# Patient Record
Sex: Female | Born: 1935 | Race: Black or African American | Hispanic: No | Marital: Single
Health system: Southern US, Community
[De-identification: ages and names within clinical notes are randomized; demographics above are authoritative.]

---

## 2004-09-11 ENCOUNTER — Ambulatory Visit: Payer: Self-pay | Admitting: Family Medicine

## 2004-10-26 ENCOUNTER — Emergency Department: Payer: Self-pay | Admitting: Unknown Physician Specialty

## 2005-09-23 ENCOUNTER — Ambulatory Visit: Payer: Self-pay | Admitting: Family Medicine

## 2006-03-31 ENCOUNTER — Emergency Department: Payer: Self-pay | Admitting: Emergency Medicine

## 2006-09-24 ENCOUNTER — Ambulatory Visit: Payer: Self-pay | Admitting: Family Medicine

## 2006-10-01 ENCOUNTER — Other Ambulatory Visit: Payer: Self-pay

## 2006-10-01 ENCOUNTER — Emergency Department: Payer: Self-pay

## 2006-10-04 ENCOUNTER — Emergency Department: Payer: Self-pay | Admitting: Emergency Medicine

## 2006-10-04 ENCOUNTER — Other Ambulatory Visit: Payer: Self-pay

## 2006-10-06 ENCOUNTER — Other Ambulatory Visit: Payer: Self-pay

## 2006-10-06 ENCOUNTER — Emergency Department: Payer: Self-pay | Admitting: Internal Medicine

## 2006-10-17 ENCOUNTER — Other Ambulatory Visit: Payer: Self-pay

## 2006-10-17 ENCOUNTER — Emergency Department: Payer: Self-pay | Admitting: Emergency Medicine

## 2006-12-06 ENCOUNTER — Emergency Department: Payer: Self-pay | Admitting: General Practice

## 2006-12-09 ENCOUNTER — Emergency Department: Payer: Self-pay | Admitting: Emergency Medicine

## 2006-12-18 ENCOUNTER — Emergency Department: Payer: Self-pay | Admitting: Emergency Medicine

## 2006-12-21 ENCOUNTER — Ambulatory Visit: Payer: Self-pay | Admitting: Urology

## 2006-12-24 ENCOUNTER — Ambulatory Visit: Payer: Self-pay | Admitting: Urology

## 2007-02-27 ENCOUNTER — Emergency Department: Payer: Self-pay | Admitting: Emergency Medicine

## 2007-02-27 ENCOUNTER — Other Ambulatory Visit: Payer: Self-pay

## 2007-03-09 ENCOUNTER — Emergency Department: Payer: Self-pay | Admitting: Emergency Medicine

## 2007-03-09 ENCOUNTER — Other Ambulatory Visit: Payer: Self-pay

## 2007-05-30 ENCOUNTER — Other Ambulatory Visit: Payer: Self-pay

## 2007-05-30 ENCOUNTER — Emergency Department: Payer: Self-pay | Admitting: Emergency Medicine

## 2007-06-03 ENCOUNTER — Emergency Department: Payer: Self-pay | Admitting: Emergency Medicine

## 2007-06-11 ENCOUNTER — Ambulatory Visit: Payer: Self-pay | Admitting: Unknown Physician Specialty

## 2007-08-16 ENCOUNTER — Ambulatory Visit: Payer: Self-pay | Admitting: Unknown Physician Specialty

## 2007-08-23 IMAGING — CR DG CHEST 2V
1 series · 2 of 2 positions shown · non-contrast
Comparison: none

REASON FOR EXAM: Weakness
COMMENTS:

[Series 1: view not recorded · 0.17mm/px · 2 of 2 slices shown]
[im 1/2]
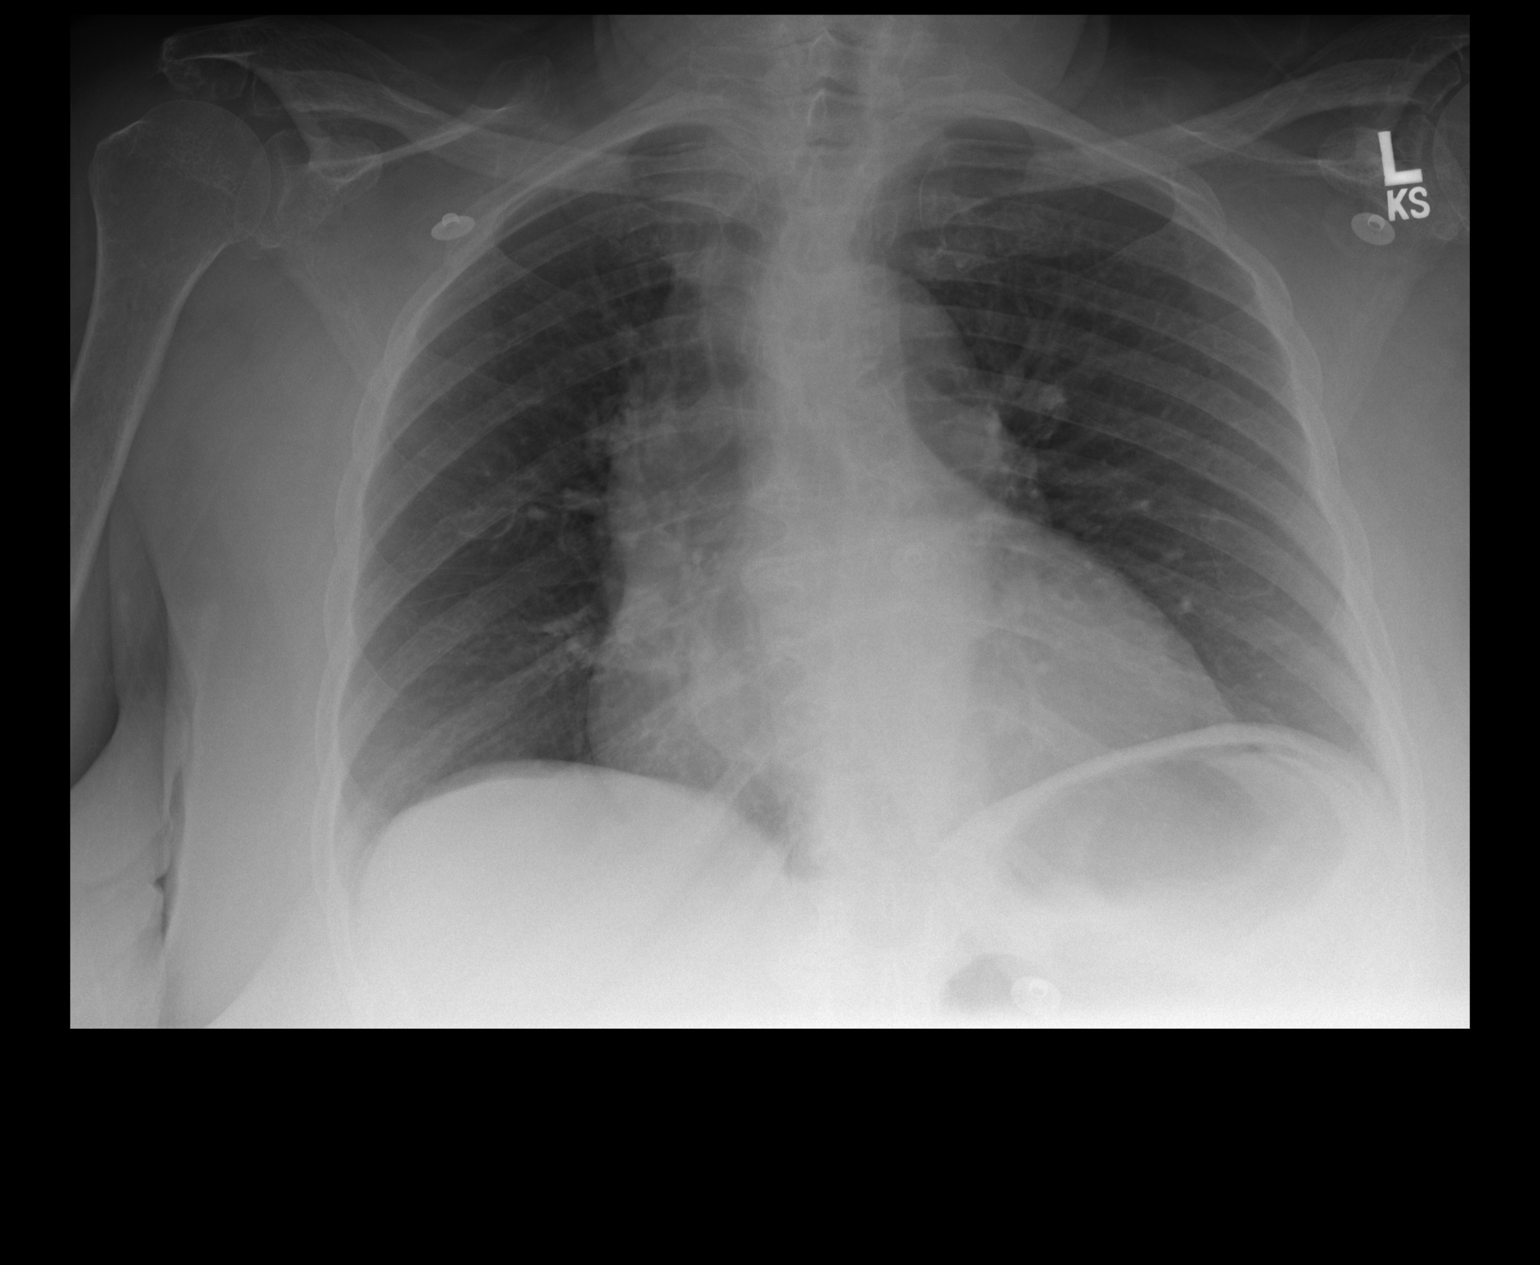
[im 2/2]
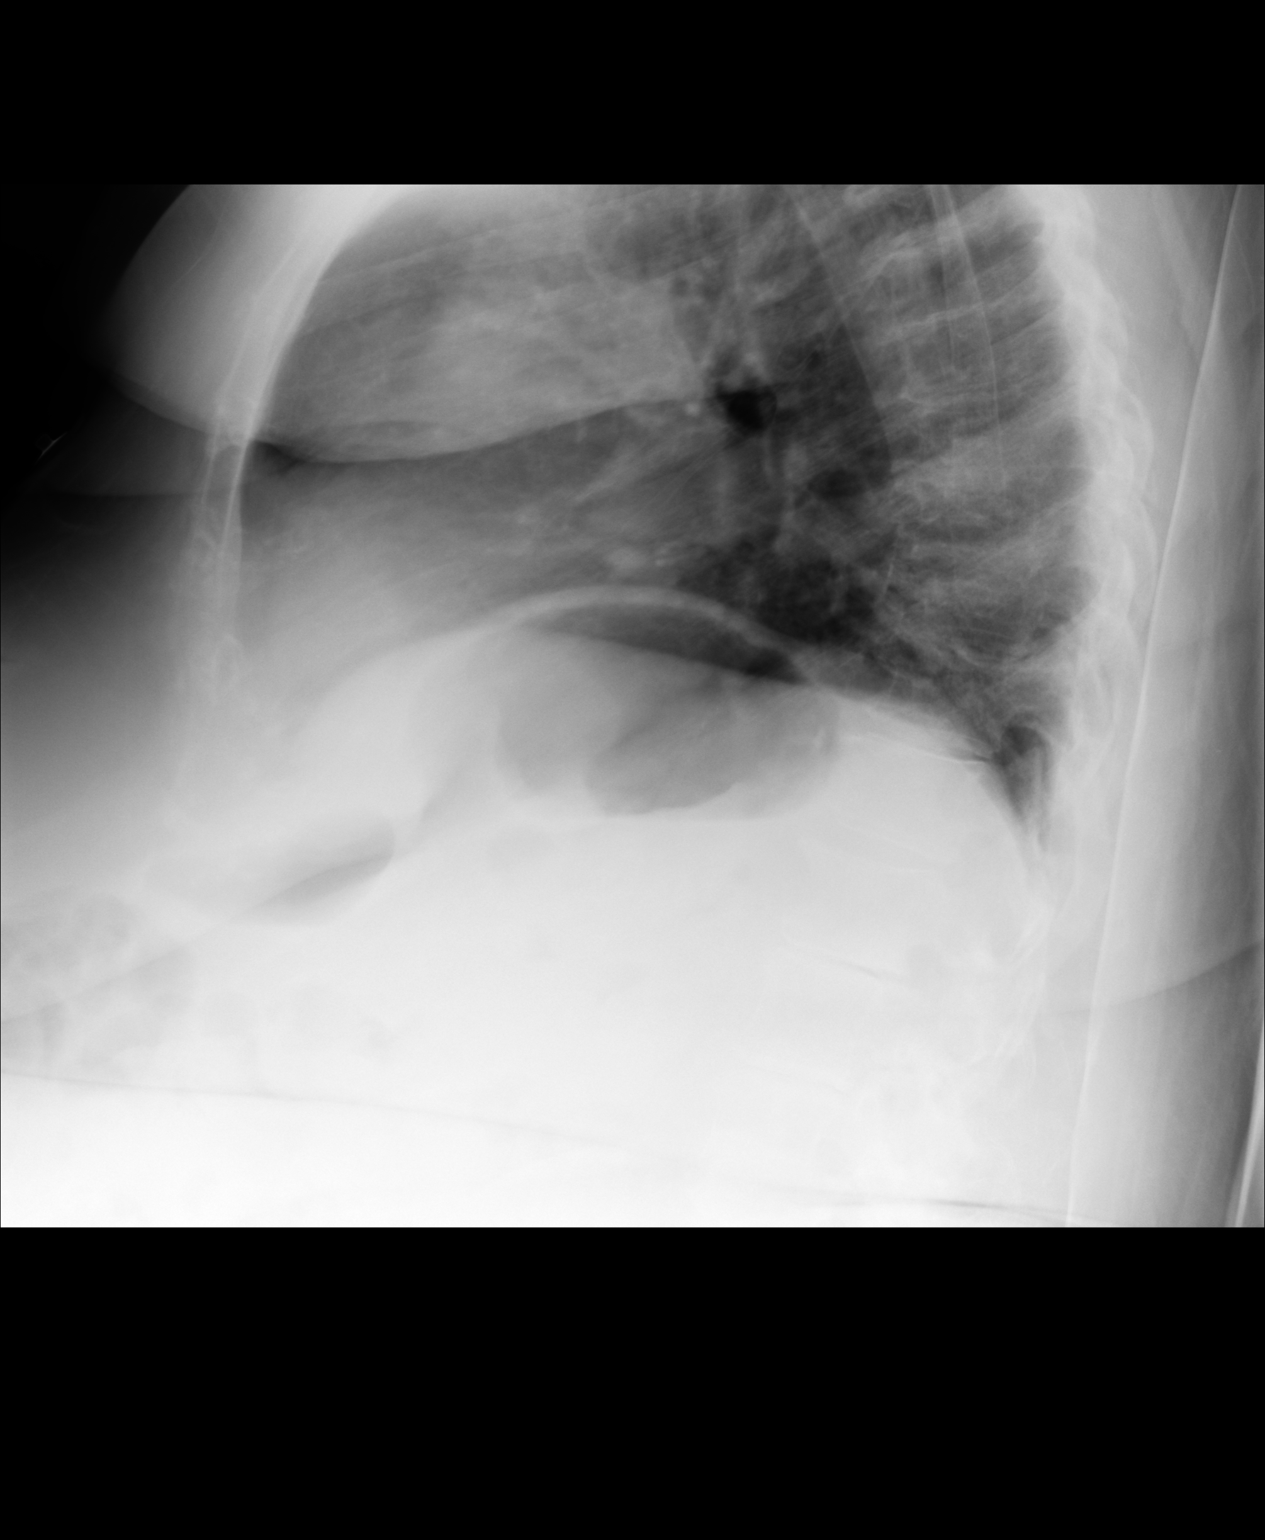

[2 of 2 positions shown; findings below may reference images not displayed]

PROCEDURE:     DXR - DXR CHEST PA (OR AP) AND LATERAL  - October 04, 2006 [DATE]

RESULT:     AP and lateral view were obtained and compared to 10/01/06.  The
heart is top normal in size.  The aortic arch is mildly tortuous.  The lung
fields appear clear with no effusions.  Vascularity is within normal limits.
 Degenerative spurs are noted in the thoracic spine.
IMPRESSION: Lung fields are clear.

## 2007-08-30 ENCOUNTER — Ambulatory Visit: Payer: Self-pay | Admitting: Unknown Physician Specialty

## 2007-11-09 ENCOUNTER — Ambulatory Visit: Payer: Self-pay | Admitting: Family Medicine

## 2008-02-14 ENCOUNTER — Ambulatory Visit: Payer: Self-pay | Admitting: Unknown Physician Specialty

## 2009-04-10 ENCOUNTER — Ambulatory Visit: Payer: Self-pay | Admitting: Ophthalmology

## 2009-07-26 ENCOUNTER — Inpatient Hospital Stay: Payer: Self-pay | Admitting: Unknown Physician Specialty

## 2014-09-12 ENCOUNTER — Ambulatory Visit: Payer: Self-pay | Admitting: Internal Medicine

## 2014-09-20 ENCOUNTER — Inpatient Hospital Stay: Payer: Self-pay | Admitting: Internal Medicine

## 2014-09-20 LAB — COMPREHENSIVE METABOLIC PANEL
AST: 16 U/L (ref 15–37)
Albumin: 3.1 g/dL — ABNORMAL LOW (ref 3.4–5.0)
Alkaline Phosphatase: 71 U/L
Anion Gap: 9 (ref 7–16)
BUN: 27 mg/dL — ABNORMAL HIGH (ref 7–18)
Bilirubin,Total: 0.5 mg/dL (ref 0.2–1.0)
CHLORIDE: 112 mmol/L — AB (ref 98–107)
CO2: 28 mmol/L (ref 21–32)
CREATININE: 1.31 mg/dL — AB (ref 0.60–1.30)
Calcium, Total: 8.7 mg/dL (ref 8.5–10.1)
EGFR (African American): 50 — ABNORMAL LOW
EGFR (Non-African Amer.): 41 — ABNORMAL LOW
Glucose: 119 mg/dL — ABNORMAL HIGH (ref 65–99)
OSMOLALITY: 302 (ref 275–301)
POTASSIUM: 3.9 mmol/L (ref 3.5–5.1)
SGPT (ALT): 13 U/L — ABNORMAL LOW
SODIUM: 149 mmol/L — AB (ref 136–145)
Total Protein: 7.3 g/dL (ref 6.4–8.2)

## 2014-09-20 LAB — URINALYSIS, COMPLETE
BLOOD: NEGATIVE
Bacteria: NONE SEEN
Bilirubin,UR: NEGATIVE
Glucose,UR: NEGATIVE mg/dL (ref 0–75)
Ketone: NEGATIVE
Nitrite: NEGATIVE
PH: 5 (ref 4.5–8.0)
PROTEIN: NEGATIVE
RBC,UR: 1 /HPF (ref 0–5)
Specific Gravity: 1.025 (ref 1.003–1.030)
WBC UR: 1 /HPF (ref 0–5)

## 2014-09-20 LAB — CBC
HCT: 29.9 % — AB (ref 35.0–47.0)
HGB: 9.7 g/dL — ABNORMAL LOW (ref 12.0–16.0)
MCH: 31.5 pg (ref 26.0–34.0)
MCHC: 32.2 g/dL (ref 32.0–36.0)
MCV: 98 fL (ref 80–100)
Platelet: 193 10*3/uL (ref 150–440)
RBC: 3.06 10*6/uL — ABNORMAL LOW (ref 3.80–5.20)
RDW: 12.1 % (ref 11.5–14.5)
WBC: 5.8 10*3/uL (ref 3.6–11.0)

## 2014-09-20 LAB — HEMOGLOBIN: HGB: 10.2 g/dL — ABNORMAL LOW (ref 12.0–16.0)

## 2014-09-20 LAB — PROTIME-INR
INR: 1.1
PROTHROMBIN TIME: 14.4 s (ref 11.5–14.7)

## 2014-09-20 LAB — LIPASE, BLOOD: Lipase: 114 U/L (ref 73–393)

## 2014-09-20 LAB — TROPONIN I: Troponin-I: <0.02 ng/mL

## 2014-09-21 LAB — BASIC METABOLIC PANEL
Anion Gap: 7 (ref 7–16)
BUN: 25 mg/dL — AB (ref 7–18)
CALCIUM: 7.6 mg/dL — AB (ref 8.5–10.1)
CHLORIDE: 117 mmol/L — AB (ref 98–107)
Co2: 23 mmol/L (ref 21–32)
Creatinine: 1.15 mg/dL (ref 0.60–1.30)
GFR CALC AF AMER: 58 — AB
GFR CALC NON AF AMER: 48 — AB
GLUCOSE: 182 mg/dL — AB (ref 65–99)
Osmolality: 301 (ref 275–301)
POTASSIUM: 4 mmol/L (ref 3.5–5.1)
SODIUM: 147 mmol/L — AB (ref 136–145)

## 2014-09-21 LAB — CBC WITH DIFFERENTIAL/PLATELET
Basophil #: 0 10*3/uL (ref 0.0–0.1)
Basophil #: 0 10*3/uL (ref 0.0–0.1)
Basophil %: 0.2 %
Basophil %: 0.1 %
EOS ABS: 0 10*3/uL (ref 0.0–0.7)
EOS PCT: 0.1 %
Eosinophil #: 0 10*3/uL (ref 0.0–0.7)
Eosinophil %: 0.1 %
HCT: 27.4 % — AB (ref 35.0–47.0)
HCT: 25.8 % — AB (ref 35.0–47.0)
HGB: 8.9 g/dL — ABNORMAL LOW (ref 12.0–16.0)
HGB: 8.3 g/dL — ABNORMAL LOW (ref 12.0–16.0)
LYMPHS PCT: 5.7 %
LYMPHS PCT: 15.5 %
Lymphocyte #: 0.6 10*3/uL — ABNORMAL LOW (ref 1.0–3.6)
Lymphocyte #: 1.6 10*3/uL (ref 1.0–3.6)
MCH: 29.7 pg (ref 26.0–34.0)
MCH: 29.9 pg (ref 26.0–34.0)
MCHC: 32.3 g/dL (ref 32.0–36.0)
MCHC: 32.1 g/dL (ref 32.0–36.0)
MCV: 92 fL (ref 80–100)
MCV: 93 fL (ref 80–100)
MONO ABS: 1 x10 3/mm — AB (ref 0.2–0.9)
MONOS PCT: 9 %
Monocyte #: 1 x10 3/mm — ABNORMAL HIGH (ref 0.2–0.9)
Monocyte %: 9.5 %
Neutrophil #: 9.5 10*3/uL — ABNORMAL HIGH (ref 1.4–6.5)
Neutrophil #: 7.6 10*3/uL — ABNORMAL HIGH (ref 1.4–6.5)
Neutrophil %: 85 %
Neutrophil %: 74.8 %
PLATELETS: 128 10*3/uL — AB (ref 150–440)
PLATELETS: 124 10*3/uL — AB (ref 150–440)
RBC: 2.98 10*6/uL — ABNORMAL LOW (ref 3.80–5.20)
RBC: 2.77 10*6/uL — AB (ref 3.80–5.20)
RDW: 16 % — AB (ref 11.5–14.5)
RDW: 16.4 % — ABNORMAL HIGH (ref 11.5–14.5)
WBC: 11.2 10*3/uL — AB (ref 3.6–11.0)
WBC: 10.2 10*3/uL (ref 3.6–11.0)

## 2014-09-21 LAB — MAGNESIUM: MAGNESIUM: 1.8 mg/dL

## 2014-09-22 LAB — CBC WITH DIFFERENTIAL/PLATELET
Basophil #: 0 10*3/uL (ref 0.0–0.1)
Basophil %: 0.1 %
EOS ABS: 0 10*3/uL (ref 0.0–0.7)
EOS PCT: 0 %
HCT: 18.8 % — ABNORMAL LOW (ref 35.0–47.0)
HGB: 6.2 g/dL — ABNORMAL LOW (ref 12.0–16.0)
Lymphocyte #: 0.9 10*3/uL — ABNORMAL LOW (ref 1.0–3.6)
Lymphocyte %: 10.9 %
MCH: 30.1 pg (ref 26.0–34.0)
MCHC: 32.6 g/dL (ref 32.0–36.0)
MCV: 92 fL (ref 80–100)
MONO ABS: 0.7 x10 3/mm (ref 0.2–0.9)
MONOS PCT: 8.8 %
NEUTROS ABS: 6.8 10*3/uL — AB (ref 1.4–6.5)
NEUTROS PCT: 80.2 %
Platelet: 97 10*3/uL — ABNORMAL LOW (ref 150–440)
RBC: 2.04 10*6/uL — AB (ref 3.80–5.20)
RDW: 15.9 % — AB (ref 11.5–14.5)
WBC: 8.5 10*3/uL (ref 3.6–11.0)

## 2014-09-22 LAB — BASIC METABOLIC PANEL
Anion Gap: 10 (ref 7–16)
BUN: 24 mg/dL — AB (ref 7–18)
CALCIUM: 7.7 mg/dL — AB (ref 8.5–10.1)
CHLORIDE: 117 mmol/L — AB (ref 98–107)
CREATININE: 1.15 mg/dL (ref 0.60–1.30)
Co2: 18 mmol/L — ABNORMAL LOW (ref 21–32)
EGFR (African American): 58 — ABNORMAL LOW
EGFR (Non-African Amer.): 48 — ABNORMAL LOW
GLUCOSE: 168 mg/dL — AB (ref 65–99)
Osmolality: 297 (ref 275–301)
Potassium: 3.9 mmol/L (ref 3.5–5.1)
Sodium: 145 mmol/L (ref 136–145)

## 2014-09-22 LAB — HEMOGLOBIN: HGB: 7.1 g/dL — AB (ref 12.0–16.0)

## 2014-10-13 ENCOUNTER — Ambulatory Visit: Payer: Self-pay | Admitting: Internal Medicine

## 2014-10-13 DEATH — deceased

## 2015-02-03 NOTE — Consult Note (Signed)
Details:   - GI Note:  Correction,  pt was off the floor for angiography.  Would be poor candidate for colonoscopy given her severe dementia.   Electronic Signatures: Dow Adolphein, Jamieka Royle (MD)  (Signed 09-Dec-15 17:50)  Authored: Details   Last Updated: 09-Dec-15 17:50 by Dow Adolphein, Mariadejesus Cade (MD)

## 2015-02-03 NOTE — Op Note (Signed)
PATIENT NAME:  Amy LusterAPP, Amy York MR#:  [id number] DATE OF BIRTH:  [birth year]  DATE OF OPERATION:  09/20/2014  PREOPERATIVE DIAGNOSES: 1. Lower gastrointestinal bleeding with hypotension and a positive bleeding scan for the cecal region.  2. Dementia.   POSTOPERATIVE DIAGNOSES:  1. Lower gastrointestinal bleeding with hypotension and a positive bleeding scan for the cecal region.  2. Dementia.   PROCEDURE:   1. Ultrasound guidance for vascular access to right femoral artery.  2. Catheter placement into ileocecal and right colic branches from right femoral approach.  3. Abdominal aortogram and selective superior mesenteric artery and superior mesenteric artery branch arteriogram.  4. Microbead embolization using 300-500 micron polyvinyl alcohol beads to the cecum and right colon.  5. StarClose closure device, right femoral artery.   SURGEON: Amy NeedyJason S. Marquee Fuchs, MD   ANESTHESIA: Local with Versed.   BLOOD LOSS: Minimal.   INDICATION FOR PROCEDURE: This is an 81 year old female who was admitted today with brisk lower GI bleeding. She has anemia, hypotension, and a bleeding scan which I had independently reviewed which was positive for bleeding in the cecal, right colon region.  She is brought emergently to the angiogram suite for further evaluation. Risks and benefits were discussed. Informed consent was obtained.   DESCRIPTION OF PROCEDURE: The patient is brought to the vascular suite. Groins were shaved and prepped and a sterile surgical field was created. Ultrasound was used to visualize a patient right femoral artery. It was accessed with direct ultrasound guidance without difficulty with Seldinger needle. A J-wire and 5 French sheath were then placed. Pigtail catheter was placed in the aorta  Dictation ends here.  Please see addendum for continuation of dictation.     ____________________________ Amy NeedyJason S. Moiz Ryant, MD jsd:LT D: 09/20/2014 17:46:00 ET T: 09/20/2014 20:24:39  ET JOB#: [id number]  cc: Amy NeedyJason S. Larrie Lucia, MD, <Dictator> Amy NeedyJASON S Cruz Bong MD ELECTRONICALLY SIGNED 10/12/2014 14:19

## 2015-02-03 NOTE — H&P (Signed)
PATIENT NAME:  Amy LusterAPP, Amy York MR#:  [id number] DATE OF BIRTH:  November 11, 1927  DATE OF ADMISSION:  09/20/2014  PRIMARY CARE PROVIDER: Dr. Amador CunasSharron Riley from [facility].   EMERGENCY DEPARTMENT REFERRING PHYSICIAN: Dr. Inocencio HomesGayle.   CHIEF COMPLAINT: GI bleed.   HISTORY OF PRESENT ILLNESS: The patient is an 81 year old with severe dementia, who [facility]. Has history of previous moderate aortic stenosis, severe mitral regurgitation, EF of 45% per last echocardiogram, also has history of atrial fibrillation, apparently CVA, osteoarthritis, hypertension, GERD, history of gastritis based on previous EGD, diverticulosis, who was noted this morning to have dark-colored stools in her diaper. The patient had a similar episode while she was in the ED here. According to her daughter, there is no history of  GI bleeding in the past. She did have a colonoscopy a few years prior which did show diverticulosis and an EGD which showed some duodenopathy. The patient otherwise is unable to give me any review of systems due to her advanced dementia. She was agitated in the ER here, so had to receive low dose haloperidol which is nothing uncommon for this patient to be agitated. She also apparently has been constipated at the facility.   PAST MEDICAL HISTORY: Significant for:  1.  Type 2 diabetes.  2.  Hypertension.  3.  Hyperlipidemia.  4.  Asthma.  5.  History of systolic CHF with severe MR and moderate aortic stenosis.  6.  Hyperlipidemia.  7.  Gastroesophageal reflux disease.  8.  Dementia with behavioral disturbances.  9.  Osteoarthritis.  10.   History of atrial fibrillation.  11.   Hyperlipidemia.  12.   History of gastritis in the past.  13.   History of diverticulosis.  14.   Coronary artery disease.   PAST SURGICAL HISTORY: Status post right cataract removal, status post left cataract removal, hemiarthroplasty of the left hip fracture.   ALLERGIES: None.   CURRENT  MEDICATIONS: Tylenol 650 two tabs q. 4 p.r.n., trazodone 100 mg 1 tab p.o. b.i.d., senna 2 tabs daily, omeprazole 20 daily, MiraLax 17 grams daily, Lipitor 10 daily, Lasix 20 daily, Effexor-XR 75 p.o. daily, diltiazem, HCTZ 180 daily, cream as needed, calcium plus vitamin D 1 tab p.o. b.i.d.   SOCIAL HISTORY: Does not smoke. Does not drink. No drugs. Currently resides at a skilled nursing facility.   FAMILY HISTORY: Positive for hypertension and diabetes.   REVIEW OF SYSTEMS: Unable to obtain due to patient's advanced dementia.   PHYSICAL EXAMINATION: VITAL SIGNS: Temperature 98, pulse 84, respirations 18, blood pressure 108/93, O2 of 99%.  GENERAL: The patient is a thin female in no acute distress.  HEENT: Head atraumatic, normocephalic. Pupils equally round, reactive to light and accommodation. There is no conjunctival pallor. No sclerae icterus. Nasal exam shows no drainage or ulceration. Oropharynx is clear without any exudate.  NECK: Supple without any JVD. No thyromegaly.  CARDIOVASCULAR: Regular rate and rhythm. No murmurs, rubs, clicks, or gallops.  LUNGS: Clear to auscultation bilaterally without any rales, rhonchi, wheezing.  ABDOMEN: Soft, nontender, nondistended. Positive bowel sounds x 4. No hepatosplenomegaly.  EXTREMITIES: No clubbing, cyanosis, or edema.  SKIN: No rash.  LYMPH NODES: Nonpalpable.  MUSCULOSKELETAL: There is no erythema or swelling.  NEUROLOGIC: The patient was agitated earlier, currently not agitated. Cranial nerves II through XII  grossly appear intact. No focal deficits.  PSYCHIATRIC: Was anxious earlier, not now.   LABORATORY DATA: Glucose 119, BUN 27, creatinine 1.31, sodium  149, potassium 3.9, chloride 112, CO2 of 28, calcium is 8.7. LFTs showed albumin of 3.1. ALT is 13. Troponin less than 0.02. Hemoglobin 9.7, platelet count 193,000.   ASSESSMENT AND PLAN: The patient is an 81 year old with advanced dementia presents with a gastrointestinal bleed.  1.   Gastrointestinal bleed. Even though her stool has been dark,  it could be a lower gastrointestinal bleed as well. Differential diagnosis: Possible upper and lower gastrointestinal bleed. In terms of upper gastrointestinal bleed, we will place her on Protonix. Gastroenterology consult. Monitor hemoglobin and hematocrit. She could also have a diverticular bleed at this time. If she has a rebleed, we will get a stat bleeding scan. Will transfuse her as needed. I have discussed the case with her daughter and consented for transfusion if needed. Risks and benefits explained. The daughter is agreeable to transfusion. She states that her mother would not be able to tolerate any major surgeries.  2.  Dementia with agitation. We will place her on p.r.n. haloperidol, continue trazodone, venlafaxine as taking previously.  3.  Hyperlipidemia. Continue atorvastatin.  4.  History of systolic congestive heart failure, continue Lasix. She is going to be on low-dose fluids.   CODE STATUS: Confirmed. She is a DNR.   TIME SPENT: 50 minutes on this patient.    ____________________________ Lacie Scotts. Allena Katz, MD shp:at D: 09/20/2014 12:15:28 ET T: 09/20/2014 12:34:45 ET JOB#: [id number]  cc: Grantley Savage H. Allena Katz, MD, <Dictator> Charise Carwin MD ELECTRONICALLY SIGNED 09/22/2014 17:13

## 2015-02-03 NOTE — Discharge Summary (Signed)
Dates of Admission and Diagnosis:  Date of Admission 20-Sep-2014   Date of Discharge 01-Jan-0001   Admitting Diagnosis Gestro intestinal bleed   Final Diagnosis Gestro intestinal bleed- embolization done hyperlipidemia Anemia due to blood loss. Failure to thrive Hypothyroidism    Chief Complaint/History of Present Illness an 81 year old with severe dementia, who [facility]. Has history of previous moderate aortic stenosis, severe mitral regurgitation, EF of 45% per last echocardiogram, also has history of atrial fibrillation, apparently CVA, osteoarthritis, hypertension, GERD, history of gastritis based on previous EGD, diverticulosis, who was noted this morning to have dark-colored stools in her diaper. The patient had a similar episode while she was in the ED here. According to her daughter, there is no history of  GI bleeding in the past. She did have a colonoscopy a few years prior which did show diverticulosis and an EGD which showed some duodenopathy. The patient otherwise is unable to give me any review of systems due to her advanced dementia. She was agitated in the ER here, so had to receive low dose haloperidol which is nothing uncommon for this patient to be agitated. She also apparently has been constipated at the facility.   Allergies:  No Known Allergies:   Pertinent Past History:  Pertinent Past History 1.  Type 2 diabetes.  2.  Hypertension.  3.  Hyperlipidemia.  4.  Asthma.  5.  History of systolic CHF with severe MR and moderate aortic stenosis.  6.  Hyperlipidemia.  7.  Gastroesophageal reflux disease.  8.  Dementia with behavioral disturbances.  9.  Osteoarthritis.  10.   History of atrial fibrillation.  11.   Hyperlipidemia.  12.   History of gastritis in the past.  13.   History of diverticulosis.  14.   Coronary artery disease.   Hospital Course:  Hospital Course 1. GI bleeding, possible upper though hx of  diverticulosis in past. s/p embolization procedure, s/p 3 unit PRBC, cont comfort care.  * anemia due to acute blood loss:  * Hypotension due to GIB: off dopamine drip for comfort care.   palliative care offered hospice home option, but family want to continue here.   Might convert inpatient hospice today.  * failure to thrive and malnutrition- on hospice services- food for comort if she wish.  2. Dementia.  3. Hyperlidemeia.  4. h/o sysotic CHF.   Condition on Discharge Stable   DISCHARGE INSTRUCTIONS HOME MEDS:  Medication Reconciliation: Patient's Home Medications at Discharge:     Medication Instructions  diltiazem hydrochloride er 180 mg/24 hours oral capsule, extended release  1 cap(s) orally once a day   trazodone 100 mg oral tablet  1 tab(s) orally 2 times a day   miralax - oral powder for reconstitution  17 gram(s) orally every other day   desitin 13% topical cream  Apply topically to affected area 2 times a day   ativan 1 mg oral tablet  1 tab(s) orally 3 times a day, As Needed - for Agitation   morphine 20 mg/ml oral concentrate  0.25 milliliter(s) orally every 4 hours, As Needed - for Agitation    STOP TAKING THE FOLLOWING MEDICATION(S):    omeprazole 20 mg oral delayed release capsule: 1 cap(s) orally once a day senna 8.6 mg oral tablet: 2 tab(s) orally once a day lipitor 10 mg oral tablet: 1 tab(s) orally once a day (at bedtime) calcium 600+d: 1 tab(s) orally 2 times a day effexor xr 75 mg  oral capsule, extended release: 1 cap(s) orally once a day lasix 20 mg oral tablet: 1 tab(s) orally once a day tylenol 325 mg oral tablet: 2 tab(s) orally every 4 hours, As Needed - for Fever, for Pain   Physician's Instructions:  Diet Regular   Activity Limitations As tolerated   Return to Work Not Applicable   Time frame for Follow Up Appointment 1-2 weeks   Other Comments hospice services at SNF.     Shawn Stall, Jane(Family Physician): [facility], [address] [place], [place] [address], [place] [phone]  TIME SPENT:  Total Time: Greater than 30 minutes   Electronic Signatures: Altamese Dilling (MD)  (Signed 15-Dec-15 14:17)  Authored: ADMISSION DATE AND DIAGNOSIS, CHIEF COMPLAINT/HPI, Allergies, PERTINENT PAST HISTORY, HOSPITAL COURSE, DISCHARGE INSTRUCTIONS HOME MEDS, PATIENT INSTRUCTIONS, Follow Up Physician, TIME SPENT   Last Updated: 15-Dec-15 14:17 by Altamese Dilling (MD)

## 2015-02-03 NOTE — Consult Note (Signed)
CHIEF COMPLAINT and HISTORY:  Subjective/Chief Complaint blood per rectum   History of Present Illness Patient admitted earlier today with brisk BRBPR.  She has dementia and was sedated for combatitiveness earlier, so she provides no history.  She apparently started bleeding from her bottom in the past 24 hours.  Brisk.  No report of pain.  No previous bleeding episodes.  Lives in a SNF.  Now with low BP and Hgb <10.  Seen in nuclear med awaiting bleeding scan.   PAST MEDICAL/SURGICAL HISTORY:  Past Medical History:   Aortic Stenosis:    Dementia:    Kidney Stones:    anemia:    htn:    hypercholestrolemia:    Diabetes:    Denies:   ALLERGIES:  Allergies:  No Known Allergies:   HOME MEDICATIONS:  Home Medications: Medication Instructions Status  Diltiazem Hydrochloride ER 180 mg/24 hours oral capsule, extended release 1 cap(s) orally once a day Active  omeprazole 20 mg oral delayed release capsule 1 cap(s) orally once a day Active  traZODone 100 mg oral tablet 1 tab(s) orally 2 times a day Active  Desitin 13% topical cream Apply topically to affected area 2 times a day Active  Desitin 13% topical cream Apply topically to affected area , As Needed Active  MiraLax - oral powder for reconstitution 17 gram(s) orally every other day Active  Senna 8.6 mg oral tablet 2 tab(s) orally once a day Active  Lipitor 10 mg oral tablet 1 tab(s) orally once a day (at bedtime) Active  Calcium 600+D 1 tab(s) orally 2 times a day Active  Effexor XR 75 mg oral capsule, extended release 1 cap(s) orally once a day Active  Lasix 20 mg oral tablet 1 tab(s) orally once a day Active  Tylenol 325 mg oral tablet 2 tab(s) orally every 4 hours, As Needed - for Fever, for Pain  Active   Family and Social History:  Family History Non-Contributory   Social History negative tobacco, negative ETOH, negative Illicit drugs   Place of Living Nursing Home   Review of Systems:  ROS Pt not able to  provide ROS   Medications/Allergies Reviewed Medications/Allergies reviewed   Physical Exam:  GEN thin, disheveled, critically ill appearing   HEENT pink conjunctivae, poor dentition   NECK No masses  trachea midline   RESP normal resp effort  rhonchi   CARD irregular rate  murmur present  no JVD   VASCULAR ACCESS none   ABD denies tenderness  soft  frankly bloody BM with severe odor   GU no superpubic tenderness   LYMPH negative neck, negative axillae   EXTR negative cyanosis/clubbing, negative edema   SKIN normal to palpation, skin turgor poor   NEURO difficult to assess.  Does not follow commands   PSYCH poor insight, sedated   LABS:  Laboratory Results: Hepatic:    09-Dec-15 10:54, Comprehensive Metabolic Panel  Bilirubin, Total 0.5  Alkaline Phosphatase 71  46-116  NOTE: New Reference Range  05/02/14  SGPT (ALT) 13  14-63  NOTE: New Reference Range  05/02/14  SGOT (AST) 16  Total Protein, Serum 7.3  Albumin, Serum 3.1  Routine BB:    09-Dec-15 12:36, Crossmatch 2 Units  Crossmatch Unit 1 Ready  Crossmatch Unit 2 Ready  Result(s) reported on 20 Sep 2014 at 04:19PM.    09-Dec-15 12:36, Type and Antibody Screen  ABO Group + Rh Type   A Positive  Antibody Screen NEGATIVE  Result(s) reported on 20 Sep 2014  at 02:07PM.  Routine Chem:    09-Dec-15 10:54, Comprehensive Metabolic Panel  Glucose, Serum 119  BUN 27  Creatinine (comp) 1.31  Sodium, Serum 149  Potassium, Serum 3.9  Chloride, Serum 112  CO2, Serum 28  Calcium (Total), Serum 8.7  Osmolality (calc) 302  eGFR (African American) 50  eGFR (Non-African American) 41  eGFR values <87m/min/1.73 m2 may be an indication of chronic  kidney disease (CKD).  Calculated eGFR, using the MRDR Study equation, is useful in   patients with stable renal function.  The eGFR calculation will not be reliable in acutely ill patients  when serum creatinine is changing rapidly. It is not useful in  patients  on dialysis. The eGFR calculation may not be applicable  to patients at the low and high extremes of body sizes, pregnant  women, and vegetarians.  Anion Gap 9    09-Dec-15 10:54, Lipase  Lipase 114  Result(s) reported on 20 Sep 2014 at 11:21AM.  Cardiac:    09-Dec-15 10:54, Troponin I  Troponin I < 0.02  0.00-0.05  0.05 ng/mL or less: NEGATIVE   Repeat testing in 3-6 hrs   if clinically indicated.  >0.05 ng/mL: POTENTIAL   MYOCARDIAL INJURY. Repeat   testing in 3-6 hrs if   clinically indicated.  NOTE: An increase or decrease   of 30% or more on serial   testing suggests a   clinically important change  Routine UA:    09-Dec-15 10:54, Urinalysis  Color (UA) Yellow  Clarity (UA) Clear  Glucose (UA) Negative  Bilirubin (UA) Negative  Ketones (UA) Negative  Specific Gravity (UA) 1.025  Blood (UA) Negative  pH (UA) 5.0  Protein (UA) Negative  Nitrite (UA) Negative  Leukocyte Esterase (UA) Trace  Result(s) reported on 20 Sep 2014 at 11:35AM.  RBC (UA) 1 /HPF  WBC (UA) 1 /HPF  Bacteria (UA)   NONE SEEN  Epithelial Cells (UA) 1 /HPF  Result(s) reported on 20 Sep 2014 at 11:35AM.  Routine Coag:    09-Dec-15 10:54, Prothrombin Time  Prothrombin 14.4  INR 1.1  INR reference interval applies to patients on anticoagulant therapy.  A single INR therapeutic range for coumarins is not optimal for all  indications; however, the suggested range for most indications is  2.0 - 3.0.  Exceptions to the INR Reference Range may include: Prosthetic heart  valves, acute myocardial infarction, prevention of myocardial  infarction, and combinations of aspirin and anticoagulant. The need  for a higher or lower target INR must be assessed individually.  Reference: The Pharmacology and Management of the Vitamin K   antagonists: the seventh ACCP Conference on Antithrombotic and  Thrombolytic Therapy. CDXAJO.8786Sept:126 (3suppl): [id number]  A HCT value >55% may artifactually increase the  PT.  In one study,   the increase was an average of 25%.  Reference:  "Effect on Routine and Special Coagulation Testing Values  of Citrate Anticoagulant Adjustment in Patients with High HCT Values."  American Journal of Clinical Pathology 2006;126:400-405.  Routine Hem:    09-Dec-15 10:54, Hemogram, Platelet Count  WBC (CBC) 5.8  RBC (CBC) 3.06  Hemoglobin (CBC) 9.7  Hematocrit (CBC) 29.9  Platelet Count (CBC) 193  Result(s) reported on 20 Sep 2014 at 11:21AM.  MCV 98  MCH 31.5  MCHC 32.2  RDW 12.1   ASSESSMENT AND PLAN:  Assessment/Admission Diagnosis brisk lower GI bleed Dementia CKD Other medical issues   Plan For bleeding scan now.  If site of bleeding can be localized,  we would plan embolization.  She is already hypotensive and this is a severe and life threatening situation.  Risks of embolization include ischemia, continued or recurrent bleeding, access related complications.  If site can not be localized, no role for embolization.      level 5 consult   Electronic Signatures: Algernon Huxley (MD)  (Signed 09-Dec-15 16:41)  Authored: Chief Complaint and History, PAST MEDICAL/SURGICAL HISTORY, ALLERGIES, HOME MEDICATIONS, Family and Social History, Review of Systems, Physical Exam, LABS, Assessment and Plan   Last Updated: 09-Dec-15 16:41 by Algernon Huxley (MD)

## 2015-02-03 NOTE — Op Note (Signed)
PATIENT NAME:  Amy LusterAPP, [name] MR#:  [id number] DATE OF BIRTH:  [birth year]  ADDENDUM:  This is a continuation.  DESCRIPTION OF PROCEDURE: The patient is brought to the vascular suite. Groins were shaved and prepped and a sterile surgical field was created. Ultrasound was used to visualize a patient right femoral artery. This was then accessed under direct ultrasound guidance without difficulty with a Seldinger needle and a permanent image recorded. A J wire and 5 French sheath were placed. Pigtail catheter was placed in the aorta at the L1 level and an AP aortogram was performed.  This showed a small infrarenal abdominal aortic aneurysm. Appeared to be  reasonably normal origin of the celiac, SMA, left renal. The right renal was not easily opacified due to overlying contrast of the visceral vessels and her rotation of her body. I initially tried a  C2 catheter without success and exchanged quickly for a VS-1 catheter and cannulated the SMA without difficulty. Our positive bleeding scan had known that we needed to get into the cecal area and proximal right colon.  A Progreat microcatheter was advanced down SMA branches into the ileocecal branch and into the cecal area. After a puff of contrast to confirm where we were, I delivered approximately 1 mL of 300-500 polyvinyl alcohol beads. I then pulled back the catheter and redirected into the right colic artery, which also fed down into the cecum and proximal right colon.  In this location through the Progreat microcatheter, I instilled another 1 mL of 300-500 micron polyvinyl alcohol beads.  At this point,  the catheter was pulled back to the main SMA beyond the secondary branches and imaging was performed which showed maintained perfusion through the large vessels, but less brisk flow in the small vessels, the cecal and right colon area. At this point, I felt we had instilled a generous amount of polyvinyl alcohol beads and felt we had slowed the flow in this area  appropriately and elected to terminate the procedure. The diagnostic catheter was removed. Oblique arteriogram was performed of the right femoral artery and StarClose closure device was deployed in the usual fashion with excellent hemostatic result. The patient was taken to the recovery room remaining hypotensive, as she was at the start of the procedure.    ____________________________ Annice NeedyJason S. Lyann Hagstrom, MD jsd:LT D: 09/20/2014 17:54:10 ET T: 09/20/2014 21:18:27 ET JOB#: [id number]  cc: Annice NeedyJason S. Ilena Dieckman, MD, <Dictator> Annice NeedyJASON S Lain Tetterton MD ELECTRONICALLY SIGNED 10/12/2014 14:19

## 2015-02-07 NOTE — Consult Note (Signed)
PATIENT NAME:  Amy LusterAPP, Amy York MR#:  [id number] DATE OF BIRTH:  1928-04-10  DATE OF CONSULTATION:  09/21/2014  REFERRING PHYSICIAN:  Imogene Burnhen, MD  CONSULTING PHYSICIAN:  Dow AdolphMatthew Jorrell Kuster, MD  REASON FOR THE CONSULT:  Lower GI bleed.   HISTORY OF PRESENT ILLNESS: Amy York is an 81 year old female with severe dementia, who presented to the hospital for evaluation of lower GI bleed. Amy York is incoherent and is unable to give any history and most of this history is per her daughter and the notes. But apparently at her facility, she was noted to have multiple episodes of blood in her diaper.   She was brought to the Emergency Room for further evaluation. From the Emergency Room, she was sent for a tagged red blood cell scan, which was positive for bleeding in the right colon. She then went for an angiography with vascular surgery. The angiography resulted in successful embolization of the cecum and right colon.   Since the embolization, she has had a small amount of blood; however, she has not had any bleeding during the course of the day today.   PAST MEDICAL HISTORY:   1.  Severe dementia.  2.  Hypertension.  3.  Diabetes.  4.  Asthma.  5.  GERD.  6.  Afib.  7.  Hyperlipidemia.  8.  Diverticulosis.  9. Coronary disease.   ALLERGIES: Are none.   MEDICATIONS: At home, Tylenol, trazodone, senna, omeprazole, MiraLax, Lipitor, Lasix, diltiazem, calcium.   SOCIAL HISTORY: She lives at a skilled nursing facility, she does not have access to tobacco or alcohol.   FAMILY HISTORY: No family history of GI malignancy.   REVIEW OF SYSTEMS: Unable to obtain.   PHYSICAL EXAM:  VITAL SIGNS: Currently her temperature is 98.7; pulse is 109, respirations are 20, blood pressure 91/58, pulse oximetry 98% on room air. The rest of her physical exam is as follows: GENERAL: Would be a chronically ill appearing female, no acute distress. She is a babbling incoherently.  HEENT: [place]/AP, her pharynx is clear.  NECK:  Soft, supple, no lymphadenopathy.  CARDIOVASCULAR: Regular, no murmurs, rubs, or gallops.  LUNGS: Clear to auscultation, no wheezes or crackles.  ABDOMEN: Scaphoid, normoactive bowel sounds, soft.  EXTREMITIES: No swelling.  SKIN: No rash or lesions.  MUSCULOSKELETAL: No obvious joint swelling or deformities.   LABORATORY DATA: Currently her white count is 10, her hemoglobin is 8.3, which is down from 8.9, at 2:00 a.York. Her hematocrit is 26, platelets are 124,000, her INR is 1.1.   ASSESSMENT AND PLAN:  Problem, lower gastrointestinal bleed: She is status post successful embolization. She has not had any further bleeding. I do suspect the mild decrease in hemoglobin today is due to fluid shifts and equilibration.   RECOMMENDATIONS:   1.  Continue to monitor hemoglobin until stable.  2.  Transfuse for hemoglobin less than 7.  3.  No indication for colonoscopy, would be poor candidate.   Thank you for this consult. Will continue to follow.    ____________________________ Dow AdolphMatthew Asal Teas, MD mr:nt D: 09/21/2014 18:04:08 ET T: 09/21/2014 23:33:06 ET JOB#: [id number]  cc: Dow AdolphMatthew [place] Souter, MD, <Dictator> Kathalene FramesMATTHEW G Ree Alcalde MD ELECTRONICALLY SIGNED 10/16/2014 14:18

## 2015-08-09 IMAGING — NM NUCLEAR MEDICINE GASTROINTESTINAL BLEEDING STUDY
1 series · 6 of 6 positions shown · non-contrast
Comparison: None.

CLINICAL DATA: Gastrointestinal bleeding

EXAM:
NUCLEAR MEDICINE GASTROINTESTINAL BLEEDING SCAN
TECHNIQUE: Sequential abdominal images were obtained following intravenous
administration of Ec-YYm labeled red blood cells.
RADIOPHARMACEUTICALS:  20.6 mCi Ec-YYm in-vitro labeled red cells.

[Series 1000: 1hr gi bleed · 4.80mm/px · 6 of 250 frames shown]
[frame 21/250]
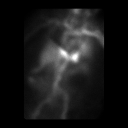
[frame 63/250]
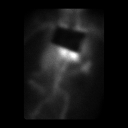
[frame 105/250]
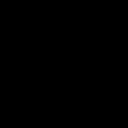
[frame 146/250]
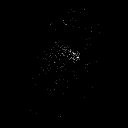
[frame 188/250]
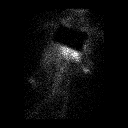
[frame 230/250]
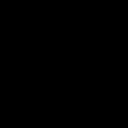

[6 of 6 positions shown; findings below may reference images not displayed]

FINDINGS: The study was and did after 15 min due to severe active
gastrointestinal bleeding. There is immediate activity in the cecum
which peristalsis through the ascending and transverse colon.
IMPRESSION: The study is positive for acute gastrointestinal bleeding in the
cecum.
